# Patient Record
Sex: Male | Born: 2000 | Race: Black or African American | Hispanic: No | Marital: Single | Smoking: Never smoker
Health system: Southern US, Community
[De-identification: ages and names within clinical notes are randomized; demographics above are authoritative.]

---

## 2019-07-30 ENCOUNTER — Encounter (HOSPITAL_BASED_OUTPATIENT_CLINIC_OR_DEPARTMENT_OTHER): Payer: Self-pay

## 2019-07-30 ENCOUNTER — Other Ambulatory Visit: Payer: Self-pay

## 2019-07-30 ENCOUNTER — Emergency Department (HOSPITAL_BASED_OUTPATIENT_CLINIC_OR_DEPARTMENT_OTHER)

## 2019-07-30 ENCOUNTER — Emergency Department (HOSPITAL_BASED_OUTPATIENT_CLINIC_OR_DEPARTMENT_OTHER)
Admission: EM | Admit: 2019-07-30 | Discharge: 2019-07-30 | Disposition: A | Discharge status: Home / Self Care | Attending: Emergency Medicine | Admitting: Emergency Medicine

## 2019-07-30 DIAGNOSIS — Y9364 Activity, baseball: Secondary | ICD-10-CM | POA: Diagnosis not present

## 2019-07-30 DIAGNOSIS — Y92320 Baseball field as the place of occurrence of the external cause: Secondary | ICD-10-CM | POA: Insufficient documentation

## 2019-07-30 DIAGNOSIS — W51XXXA Accidental striking against or bumped into by another person, initial encounter: Secondary | ICD-10-CM | POA: Insufficient documentation

## 2019-07-30 DIAGNOSIS — M25561 Pain in right knee: Secondary | ICD-10-CM | POA: Diagnosis present

## 2019-07-30 DIAGNOSIS — Y999 Unspecified external cause status: Secondary | ICD-10-CM | POA: Diagnosis not present

## 2019-07-30 DIAGNOSIS — T1490XA Injury, unspecified, initial encounter: Secondary | ICD-10-CM

## 2019-07-30 MED ORDER — NAPROXEN 500 MG PO TABS: 500.0000 mg | ORAL_TABLET | Freq: Two times a day (BID) | ORAL | 0 refills | Status: DC

## 2019-07-30 NOTE — Discharge Instructions (Addendum)
Please read and follow all provided instructions.  You have been seen today for right knee pain  Tests performed today include: An x-ray of the affected area - does NOT show any broken bones or dislocations.  Vital signs. See below for your results today.   Home care instructions: -- *PRICE in the first 24-48 hours after injury: Protect (with brace, splint, sling), if given by your provider Rest Ice- Do not apply ice pack directly to your skin, place towel or similar between your skin and ice/ice pack. Apply ice for 20 min, then remove for 40 min while awake Compression- Wear brace, elastic bandage, splint as directed by your provider Elevate affected extremity above the level of your heart when not walking around for the first 24-48 hours   Medications:  - Naproxen is a nonsteroidal anti-inflammatory medication that will help with pain and swelling. Be sure to take this medication as prescribed with food, 1 pill every 12 hours,  It should be taken with food, as it can cause stomach upset, and more seriously, stomach bleeding. Do not take other nonsteroidal anti-inflammatory medications with this such as Advil, Motrin, Aleve, Mobic, Goodie Powder, or Motrin.    You make take Tylenol per over the counter dosing with these medications.   We have prescribed you new medication(s) today. Discuss the medications prescribed today with your pharmacist as they can have adverse effects and interactions with your other medicines including over the counter and prescribed medications. Seek medical evaluation if you start to experience new or abnormal symptoms after taking one of these medicines, seek care immediately if you start to experience difficulty breathing, feeling of your throat closing, facial swelling, or rash as these could be indications of a more serious allergic reaction   Follow-up instructions: Please follow-up with your primary care provider or the provided orthopedic physician (bone  specialist) if you continue to have significant pain in 1 week. In this case you may have a more severe injury that requires further care.   Return instructions:  Please return if your digits or extremity are numb or tingling, appear gray or blue, or you have severe pain (also elevate the extremity and loosen splint or wrap if you were given one) Please return if you have redness or fevers.  Please return to the Emergency Department if you experience worsening symptoms.  Please return if you have any other emergent concerns. Additional Information:  Your vital signs today were: BP 118/66 (BP Location: Right Arm)    Pulse 99    Temp 98.3 F (36.8 C) (Oral)    Resp 18    Ht 5\' 7"  (1.702 m)    Wt 80.3 kg    SpO2 100%    BMI 27.72 kg/m  If your blood pressure (BP) was elevated above 135/85 this visit, please have this repeated by your doctor within one month. ---------------

## 2019-07-30 NOTE — ED Provider Notes (Addendum)
[place] EMERGENCY DEPARTMENT Provider Note   CSN: [id number] Arrival date & time: 07/30/19  1604     History   Chief Complaint Chief Complaint  Patient presents with  . Knee Pain    HPI Wesley Erickson is a 18 y.o. male without significant past medical history who presents to the emergency department status post right lower extremity injury shortly PTA with complaints of knee, lower leg, and foot pain.  Patient states that he was playing baseball, running, when his knee collided with another player and he felt that he twisted around.  He states he is having pain primarily in the knee that radiates into the lower leg anteriorly as well as pain to the outside of the foot.  Discomfort is worse with movement.  No alleviating factors.  No intervention prior to arrival.  Denies fever, chills, numbness, tingling, weakness, other areas of injury. He has been ambulatory s/p injury.      HPI  History reviewed. No pertinent past medical history.  There are no active problems to display for this patient.   History reviewed. No pertinent surgical history.      Home Medications    Prior to Admission medications   Not on File    Family History History reviewed. No pertinent family history.  Social History Social History   Tobacco Use  . Smoking status: Never Smoker  . Smokeless tobacco: Never Used  Substance Use Topics  . Alcohol use: Not Currently  . Drug use: Never     Allergies   Patient has no allergy information on record.   Review of Systems Review of Systems  Constitutional: Negative for chills and fever.  Respiratory: Negative for shortness of breath.   Cardiovascular: Negative for chest pain.  Musculoskeletal: Positive for arthralgias and myalgias. Negative for back pain and neck pain.  Neurological: Negative for weakness and numbness.   Physical Exam Updated Vital Signs BP 118/66 (BP Location: Right Arm)   Pulse 99   Temp 98.3 F (36.8 C)  (Oral)   Resp 18   Ht 5\' 7"  (1.702 m)   Wt 80.3 kg   SpO2 100%   BMI 27.72 kg/m   Physical Exam Vitals signs and nursing note reviewed.  Constitutional:      General: He is not in acute distress.    Appearance: He is not ill-appearing or toxic-appearing.  HENT:     Head: Normocephalic and atraumatic.     Comments: No racoon eyes or battle sign.  Cardiovascular:     Rate and Rhythm: Normal rate.     Pulses:          Dorsalis pedis pulses are 2+ on the right side and 2+ on the left side.       Posterior tibial pulses are 2+ on the right side and 2+ on the left side.  Pulmonary:     Effort: Pulmonary effort is normal.     Breath sounds: Normal breath sounds.  Abdominal:     General: There is no distension.     Palpations: Abdomen is soft.     Tenderness: There is no abdominal tenderness.  Musculoskeletal:     Comments: Lower extremities: No obvious deformity, appreciable  erythema, ecchymosis, warmth, or open wounds. Patient has intact AROM to bilateral hips, knees, ankles, and all digits.  RLE: Patient is tender to palpation to the R patella, patella tendon region, and just medial to the patella. There is also tenderness to the mid R anterior  tibia and the midfoot. Otherwise Les are nontender. Compartments are soft.   Skin:    General: Skin is warm and dry.     Capillary Refill: Capillary refill takes less than 2 seconds.  Neurological:     Mental Status: He is alert.     Comments: Alert. Clear speech. Sensation grossly intact to bilateral lower extremities. 5/5 strength with knee flexion/extension and ankle plantar/dorsiflexion bilaterally. Patient ambulatory with antalgic gait.   Psychiatric:        Mood and Affect: Mood normal.        Behavior: Behavior normal.      ED Treatments / Results  Labs (all labs ordered are listed, but only abnormal results are displayed) Labs Reviewed - No data to display  EKG None  Radiology Dg Tibia/fibula Right  Result Date:  07/30/2019 CLINICAL DATA:  Baseball injury, twisting injury to knee EXAM: RIGHT KNEE - COMPLETE 4+ VIEW; RIGHT FOOT - 2 VIEW; RIGHT TIBIA AND FIBULA - 2 VIEW COMPARISON:  None. FINDINGS: No fracture or dislocation of the right knee. Joint spaces are well preserved. No knee joint effusion. Soft tissue edema overlying the anterior extensor mechanism. No fracture or dislocation of the right tibia or fibula. Soft tissues are unremarkable. No fracture or dislocation of the right foot. Joint spaces are preserved. Soft tissues are unremarkable. IMPRESSION: 1. No fracture or dislocation of the right knee. Joint spaces are well preserved. No knee joint effusion. Soft tissue edema overlying the anterior extensor mechanism. 2.  No fracture or dislocation of the right tibia or fibula. 3.  No fracture or dislocation of the right foot. Electronically Signed   By: Lauralyn PrimesAlex  Bibbey M.D.   On: 07/30/2019 16:41   Dg Knee Complete 4 Views Right  Result Date: 07/30/2019 CLINICAL DATA:  Baseball injury, twisting injury to knee EXAM: RIGHT KNEE - COMPLETE 4+ VIEW; RIGHT FOOT - 2 VIEW; RIGHT TIBIA AND FIBULA - 2 VIEW COMPARISON:  None. FINDINGS: No fracture or dislocation of the right knee. Joint spaces are well preserved. No knee joint effusion. Soft tissue edema overlying the anterior extensor mechanism. No fracture or dislocation of the right tibia or fibula. Soft tissues are unremarkable. No fracture or dislocation of the right foot. Joint spaces are preserved. Soft tissues are unremarkable. IMPRESSION: 1. No fracture or dislocation of the right knee. Joint spaces are well preserved. No knee joint effusion. Soft tissue edema overlying the anterior extensor mechanism. 2.  No fracture or dislocation of the right tibia or fibula. 3.  No fracture or dislocation of the right foot. Electronically Signed   By: Lauralyn PrimesAlex  Bibbey M.D.   On: 07/30/2019 16:41   Dg Foot 2 Views Right  Result Date: 07/30/2019 CLINICAL DATA:  Baseball injury,  twisting injury to knee EXAM: RIGHT KNEE - COMPLETE 4+ VIEW; RIGHT FOOT - 2 VIEW; RIGHT TIBIA AND FIBULA - 2 VIEW COMPARISON:  None. FINDINGS: No fracture or dislocation of the right knee. Joint spaces are well preserved. No knee joint effusion. Soft tissue edema overlying the anterior extensor mechanism. No fracture or dislocation of the right tibia or fibula. Soft tissues are unremarkable. No fracture or dislocation of the right foot. Joint spaces are preserved. Soft tissues are unremarkable. IMPRESSION: 1. No fracture or dislocation of the right knee. Joint spaces are well preserved. No knee joint effusion. Soft tissue edema overlying the anterior extensor mechanism. 2.  No fracture or dislocation of the right tibia or fibula. 3.  No fracture or dislocation of  the right foot. Electronically Signed   By: Lauralyn Primes M.D.   On: 07/30/2019 16:41    Procedures Procedures (including critical care time)  SPLINT APPLICATION Date: 07/30/19 Authorized by: Harvie Heck Consent: Verbal consent obtained. Risks and benefits: risks, benefits and alternatives were discussed Consent given by: patient Splint applied by: ED RN/EDT Location details: RLE Splint type: knee immobilizer Supplies used: knee immobilizer  Post-procedure: The splinted body part was neurovascularly unchanged following the procedure. Patient tolerance: Patient tolerated the procedure well with no immediate complications.   Medications Ordered in ED Medications - No data to display   Initial Impression / Assessment and Plan / ED Course  I have reviewed the triage vital signs and the nursing notes.  Pertinent labs & imaging results that were available during my care of the patient were reviewed by me and considered in my medical decision making (see chart for details).    Patient presents to the ED with complaints of pain to the  RLE s/p injury shortly PTA. Exam without obvious deformity or open wounds. ROM intact. Tender to  palpation to the anterior/medial knee, mid lower leg, and mid foot. NVI distally. Xray negative for fracture/dislocation. Therapeutic splint provided. PRICE and recommended. Naproxen prescription. I discussed results, treatment plan, need for follow-up, and return precautions with the patient. Provided opportunity for questions, patient confirmed understanding and are in agreement with plan.    Final Clinical Impressions(s) / ED Diagnoses   Final diagnoses:  Acute pain of right knee    ED Discharge Orders         Ordered    naproxen (NAPROSYN) 500 MG tablet  2 times daily     07/30/19 1705           Dayvon Dax, [place] R, PA-C 07/30/19 1715  Addendum for knee immobilizer application    Cherly Anderson, PA-C 07/30/19 1743    Virgina Norfolk, DO 07/30/19 1814

## 2019-07-30 NOTE — ED Triage Notes (Signed)
Pt at baseball practice ran into other player causing knee to twist inward. States tenderness to patella area that radiates down medial aspect of anterior calf.

## 2019-07-30 NOTE — ED Notes (Signed)
ED Provider at bedside. 

## 2020-09-06 ENCOUNTER — Ambulatory Visit (HOSPITAL_COMMUNITY)
Admission: EM | Admit: 2020-09-06 | Discharge: 2020-09-06 | Disposition: A | Discharge status: Home / Self Care | Attending: Family Medicine | Admitting: Family Medicine

## 2020-09-06 ENCOUNTER — Encounter (HOSPITAL_COMMUNITY): Payer: Self-pay | Admitting: Emergency Medicine

## 2020-09-06 ENCOUNTER — Other Ambulatory Visit: Payer: Self-pay

## 2020-09-06 DIAGNOSIS — Z113 Encounter for screening for infections with a predominantly sexual mode of transmission: Secondary | ICD-10-CM | POA: Diagnosis present

## 2020-09-06 NOTE — ED Triage Notes (Signed)
Pt presents for std testing. Pt states his partner states that she is having discharge and some symptoms so he just wanted to be checked. He is asymptomatic.

## 2020-09-06 NOTE — Discharge Instructions (Addendum)
Swab sent for testing.  We will call you with any positive results.  You can also check your MyChart for results.

## 2020-09-06 NOTE — ED Provider Notes (Signed)
[facility]    CSN: [id number] Arrival date & time: 09/06/20  1218      History   Chief Complaint Chief Complaint  Patient presents with  . Exposure to STD    HPI Wesley Erickson is a 19 y.o. male.   Patient is a 19 year old male presents today for STD screening.  Reporting his partner stated to him that she was having some abnormal discharge and other symptoms and he just would like to be checked.  He is currently asymptomatic.     History reviewed. No pertinent past medical history.  There are no problems to display for this patient.   History reviewed. No pertinent surgical history.     Home Medications    Prior to Admission medications   Medication Sig Start Date End Date Taking? Authorizing Provider  naproxen (NAPROSYN) 500 MG tablet Take 1 tablet (500 mg total) by mouth 2 (two) times daily. 07/30/19   Petrucelli, Pleas Koch, PA-C    Family History History reviewed. No pertinent family history.  Social History Social History   Tobacco Use  . Smoking status: Never Smoker  . Smokeless tobacco: Never Used  Substance Use Topics  . Alcohol use: Not Currently  . Drug use: Never     Allergies   Patient has no known allergies.   Review of Systems Review of Systems   Physical Exam Triage Vital Signs ED Triage Vitals  Enc Vitals Group     BP 09/06/20 1237 117/70     Pulse Rate 09/06/20 1237 92     Resp 09/06/20 1237 17     Temp 09/06/20 1237 98.3 F (36.8 C)     Temp Source 09/06/20 1237 Oral     SpO2 09/06/20 1237 99 %     Weight --      Height --      Head Circumference --      Peak Flow --      Pain Score 09/06/20 1235 0     Pain Loc --      Pain Edu? --      Excl. in [place]? --    No data found.  Updated Vital Signs BP 117/70 (BP Location: Left Arm)   Pulse 92   Temp 98.3 F (36.8 C) (Oral)   Resp 17   SpO2 99%   Visual Acuity Right Eye Distance:   Left Eye Distance:   Bilateral Distance:    Right Eye Near:   Left  Eye Near:    Bilateral Near:     Physical Exam Vitals and nursing note reviewed.  Constitutional:      Appearance: Normal appearance.  HENT:     Head: Normocephalic and atraumatic.     Nose: Nose normal.  Eyes:     Conjunctiva/sclera: Conjunctivae normal.  Pulmonary:     Effort: Pulmonary effort is normal.  Musculoskeletal:        General: Normal range of motion.     Cervical back: Normal range of motion.  Skin:    General: Skin is warm and dry.  Neurological:     Mental Status: He is alert.  Psychiatric:        Mood and Affect: Mood normal.      UC Treatments / Results  Labs (all labs ordered are listed, but only abnormal results are displayed) Labs Reviewed  CYTOLOGY, (ORAL, ANAL, URETHRAL) ANCILLARY ONLY    EKG   Radiology No results found.  Procedures Procedures (including critical care time)  Medications Ordered in UC Medications - No data to display  Initial Impression / Assessment and Plan / UC Course  I have reviewed the triage vital signs and the nursing notes.  Pertinent labs & imaging results that were available during my care of the patient were reviewed by me and considered in my medical decision making (see chart for details).     STD screening Swab sent for testing.  MyChart for results. Final Clinical Impressions(s) / UC Diagnoses   Final diagnoses:  Screen for STD (sexually transmitted disease)     Discharge Instructions     Swab sent for testing.  We will call you with any positive results.  You can also check your MyChart for results.   ED Prescriptions    None     PDMP not reviewed this encounter.   Janace Aris, NP 09/06/20 1452

## 2020-09-07 LAB — CYTOLOGY, (ORAL, ANAL, URETHRAL) ANCILLARY ONLY
Chlamydia: POSITIVE — AB
Comment: NEGATIVE
Comment: NEGATIVE
Comment: NORMAL
Neisseria Gonorrhea: NEGATIVE
Trichomonas: NEGATIVE

## 2020-09-08 ENCOUNTER — Telehealth (HOSPITAL_COMMUNITY): Payer: Self-pay | Admitting: Emergency Medicine

## 2020-09-08 MED ORDER — DOXYCYCLINE HYCLATE 100 MG PO CAPS: 100.0000 mg | ORAL_CAPSULE | Freq: Two times a day (BID) | ORAL | 0 refills | Status: AC

## 2020-09-22 IMAGING — DX DG TIBIA/FIBULA 2V*R*
2 series · 2 of 2 positions shown · non-contrast
Comparison: None.

CLINICAL DATA: Baseball injury, twisting injury to knee

EXAM:
RIGHT KNEE - COMPLETE 4+ VIEW; RIGHT FOOT - 2 VIEW; RIGHT TIBIA AND
FIBULA - 2 VIEW

[tibia ap (1 of 2)]
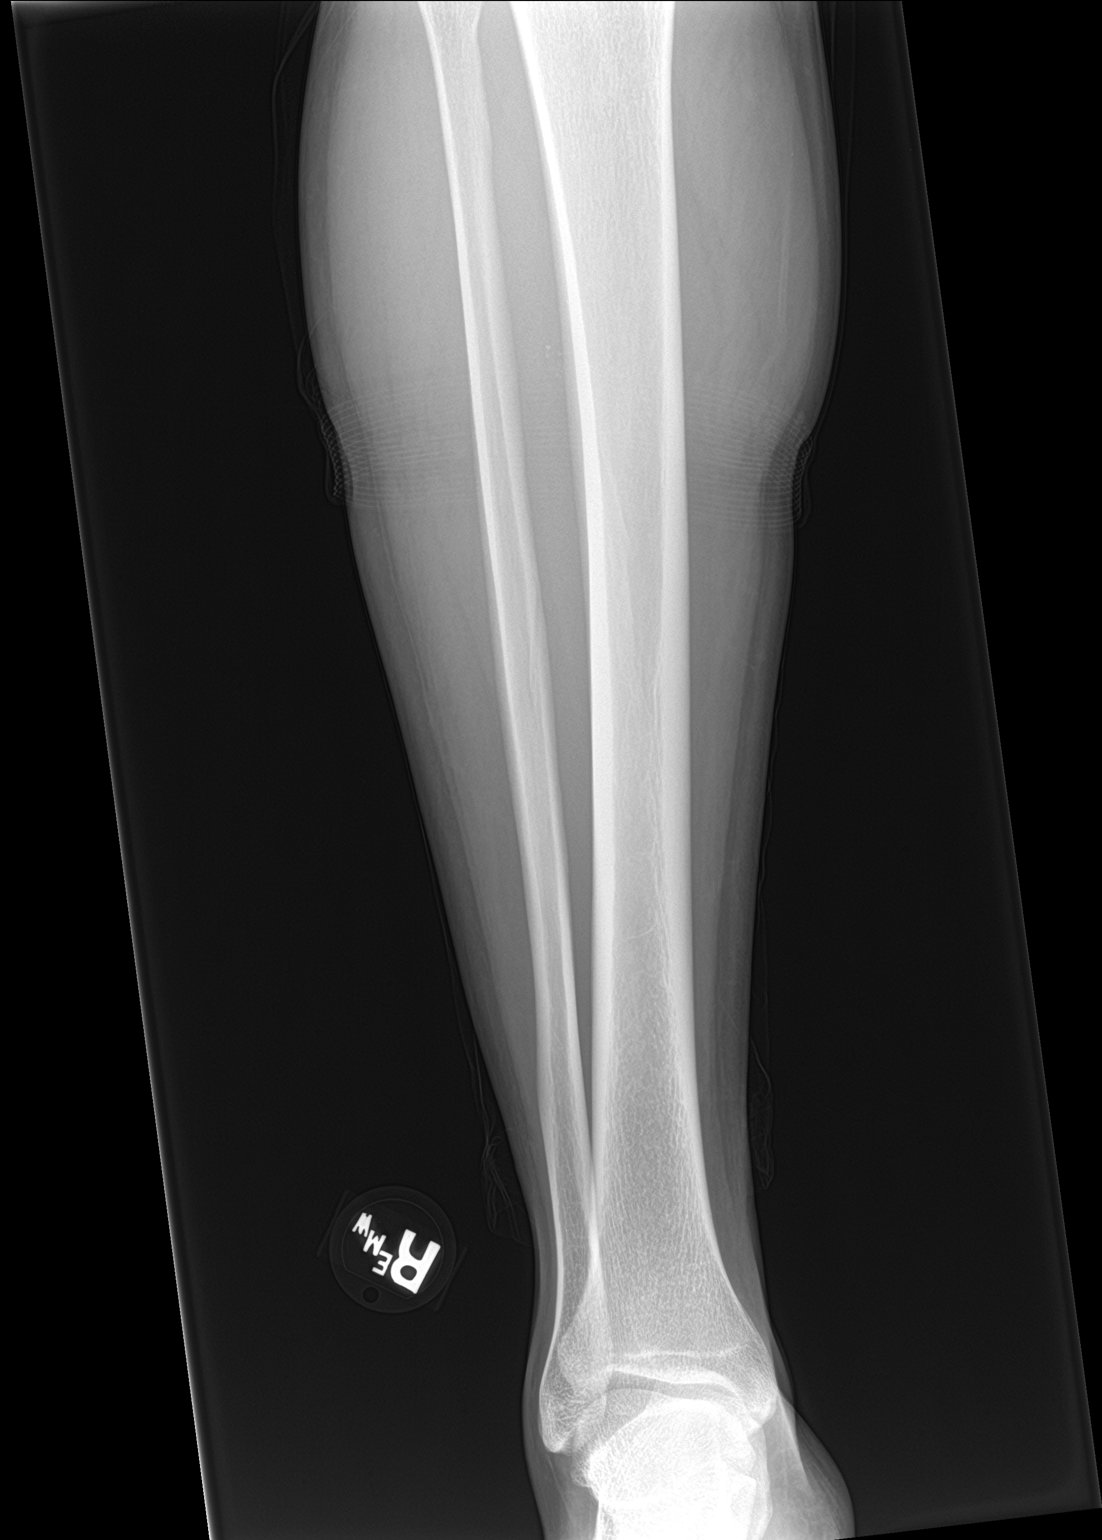

[tibia ap (2 of 2)]
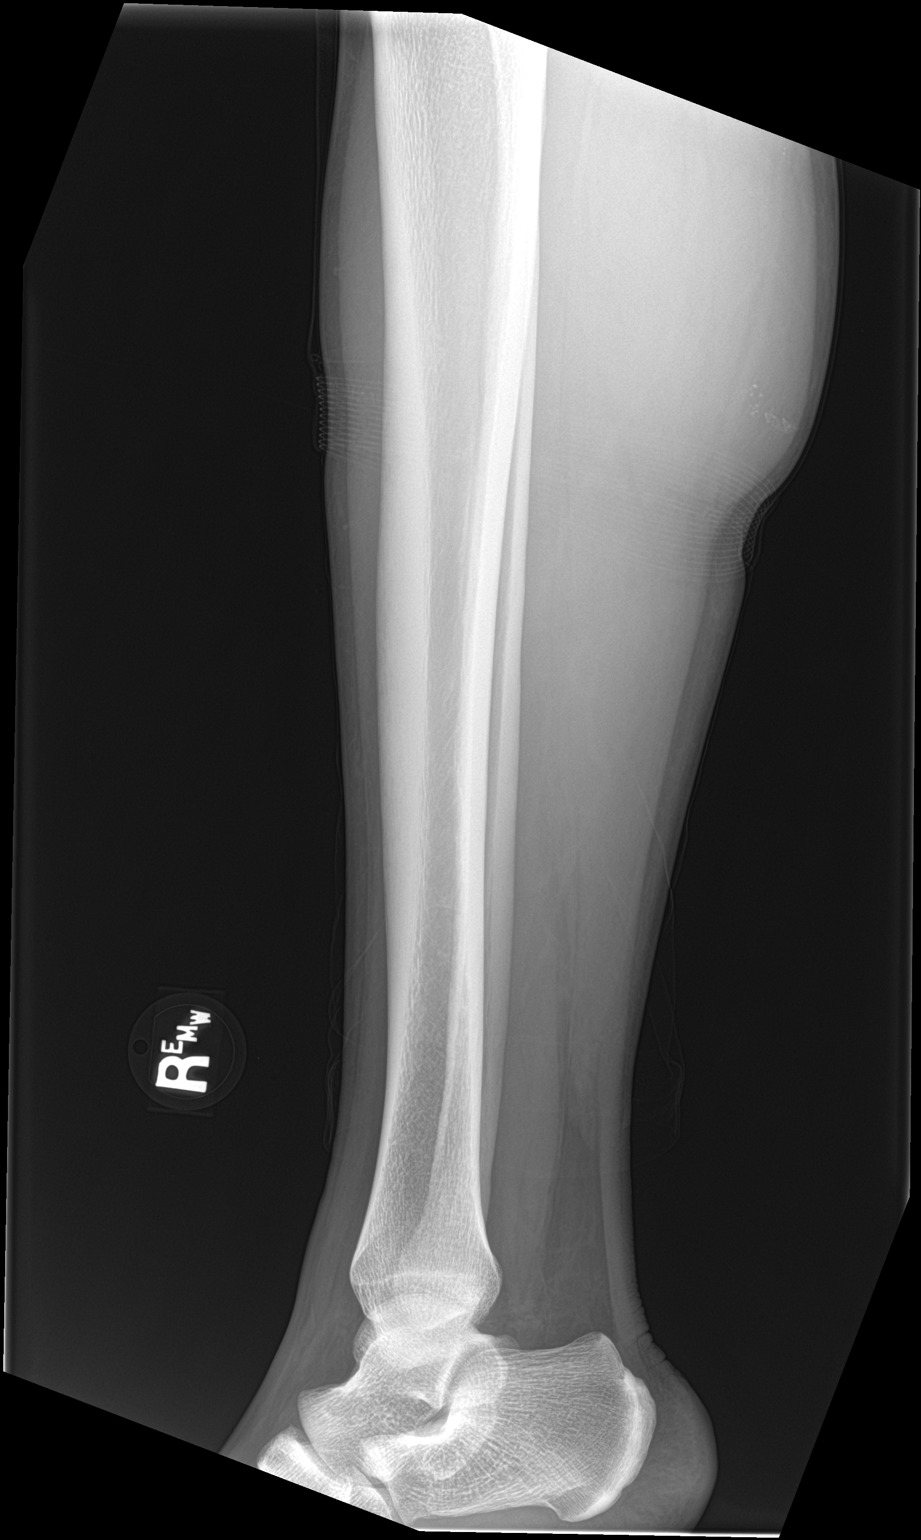

[2 of 2 positions shown; findings below may reference images not displayed]

FINDINGS: No fracture or dislocation of the right knee. Joint spaces are well
preserved. No knee joint effusion. Soft tissue edema overlying the
anterior extensor mechanism.

No fracture or dislocation of the right tibia or fibula. Soft
tissues are unremarkable.

No fracture or dislocation of the right foot. Joint spaces are
preserved. Soft tissues are unremarkable.
IMPRESSION: 1. No fracture or dislocation of the right knee. Joint spaces are
well preserved. No knee joint effusion. Soft tissue edema overlying
the anterior extensor mechanism.

2.  No fracture or dislocation of the right tibia or fibula.

3.  No fracture or dislocation of the right foot.

## 2020-09-22 IMAGING — DX DG KNEE COMPLETE 4+V*R*
4 series · 4 of 4 positions shown · non-contrast
Comparison: None.

CLINICAL DATA: Baseball injury, twisting injury to knee

EXAM:
RIGHT KNEE - COMPLETE 4+ VIEW; RIGHT FOOT - 2 VIEW; RIGHT TIBIA AND
FIBULA - 2 VIEW

[knee ap]
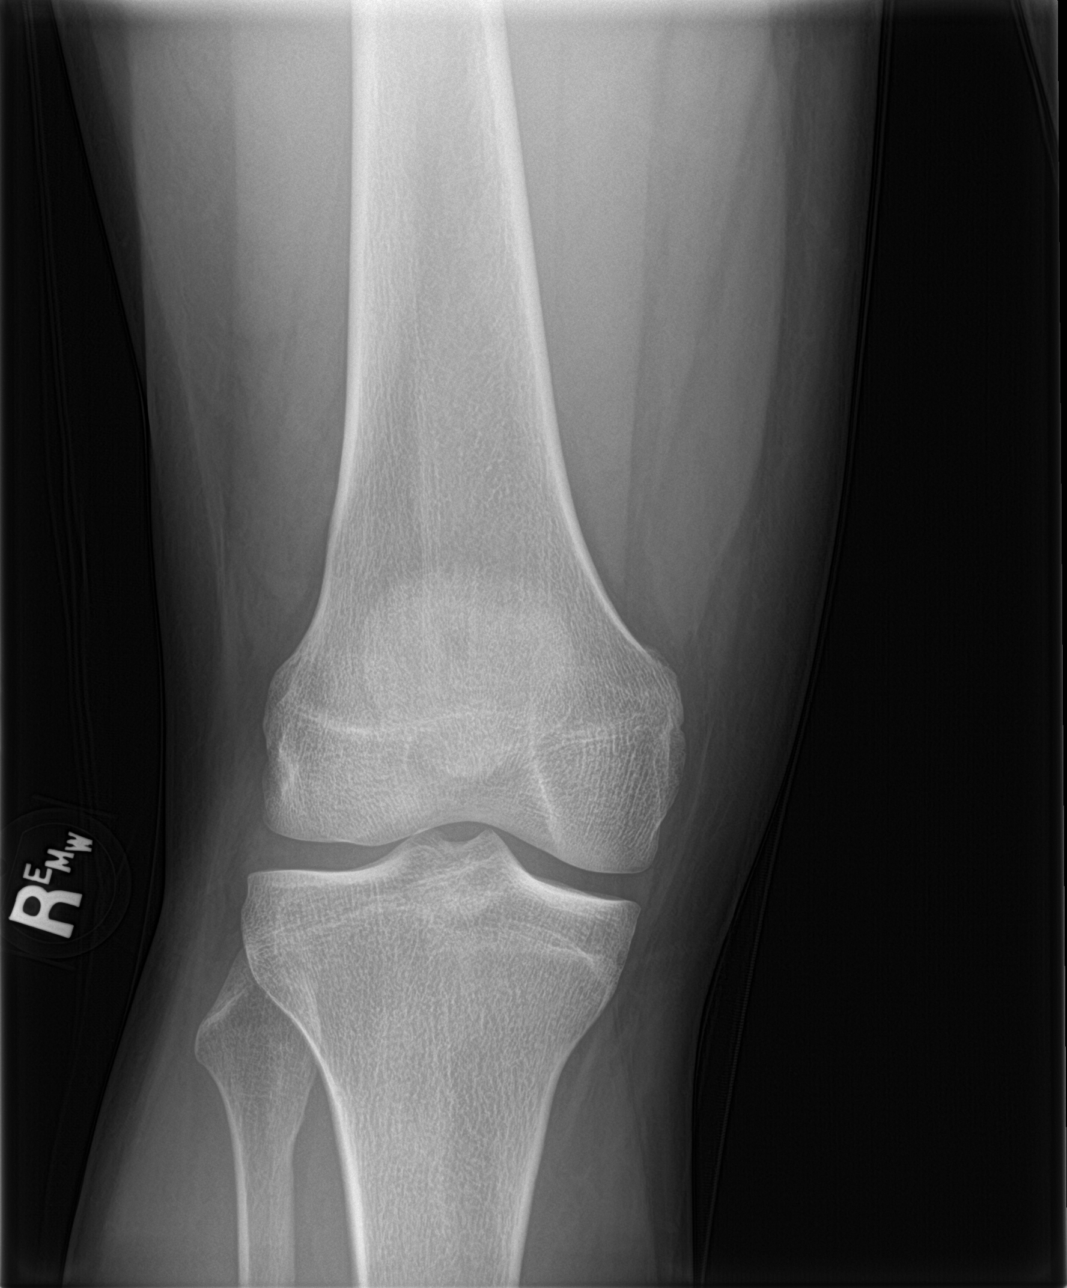

[knee lat]
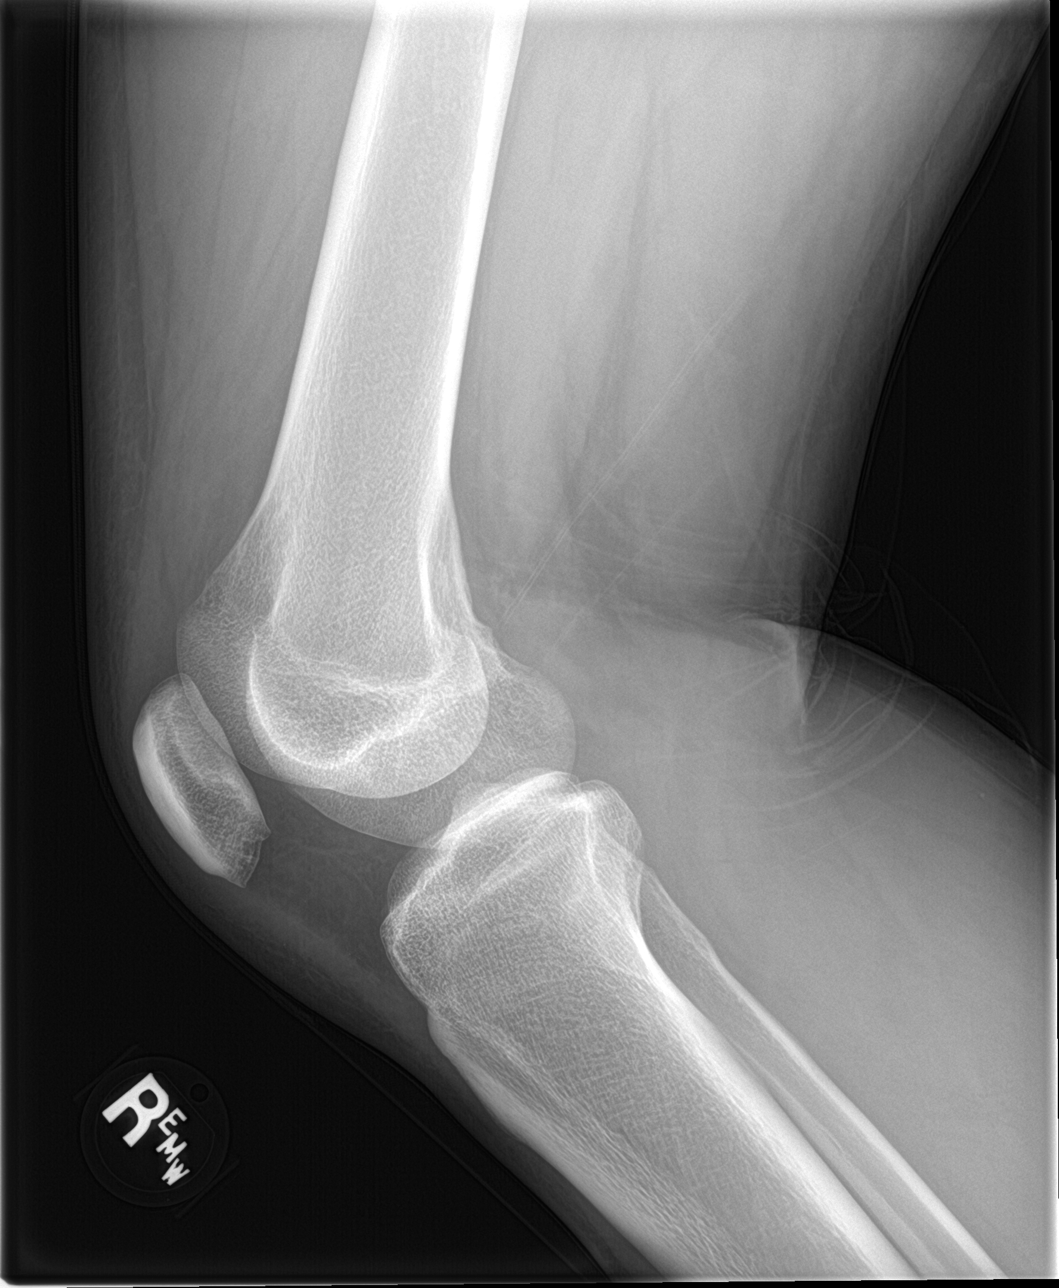

[knee obl (1 of 2)]
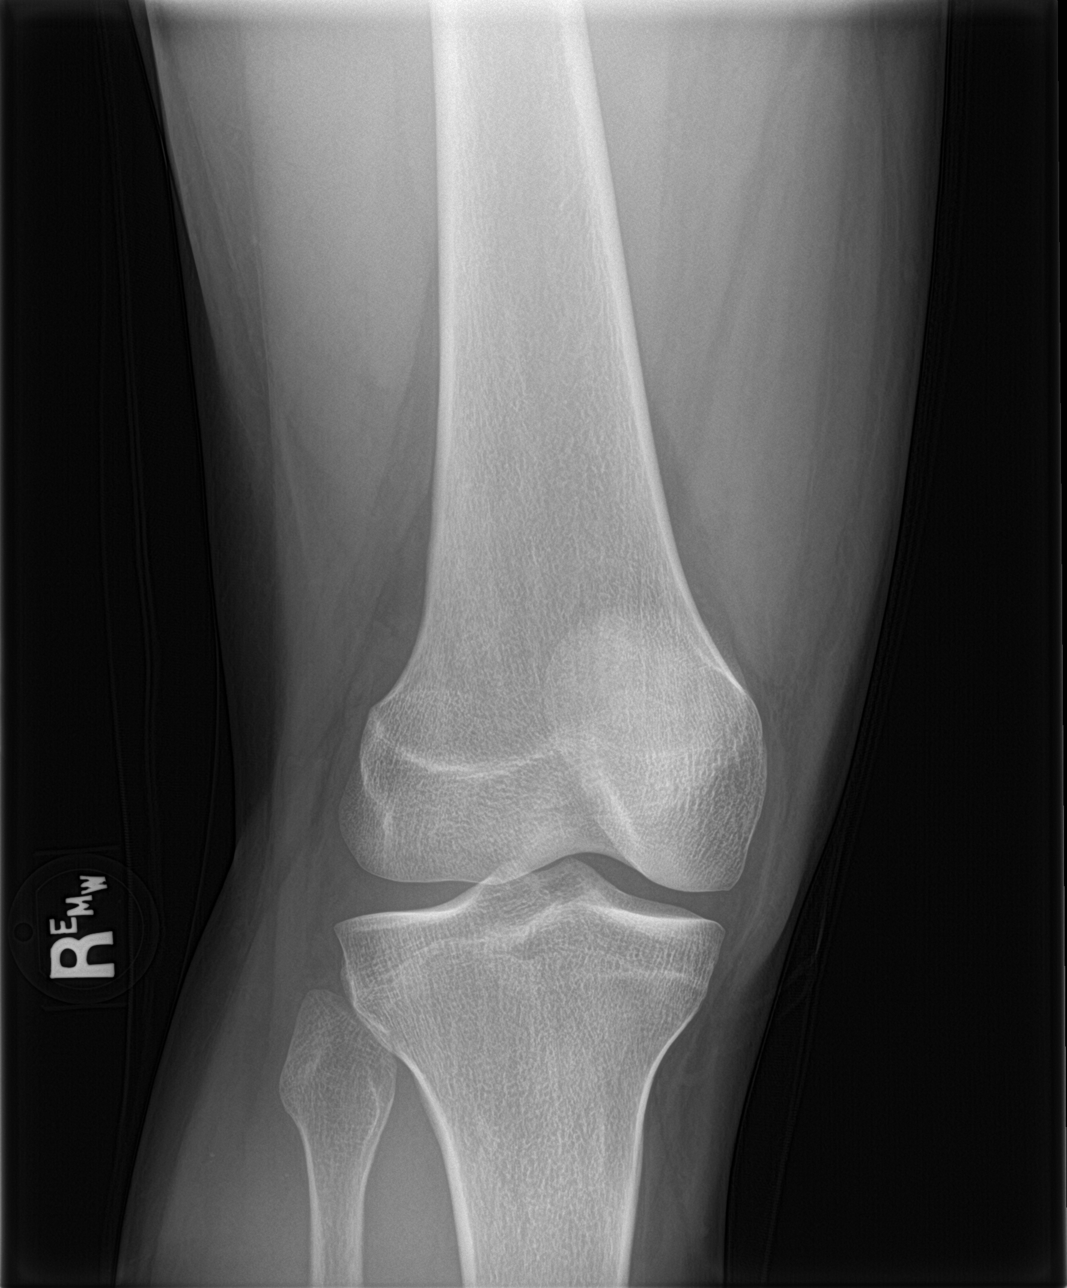

[knee obl (2 of 2)]
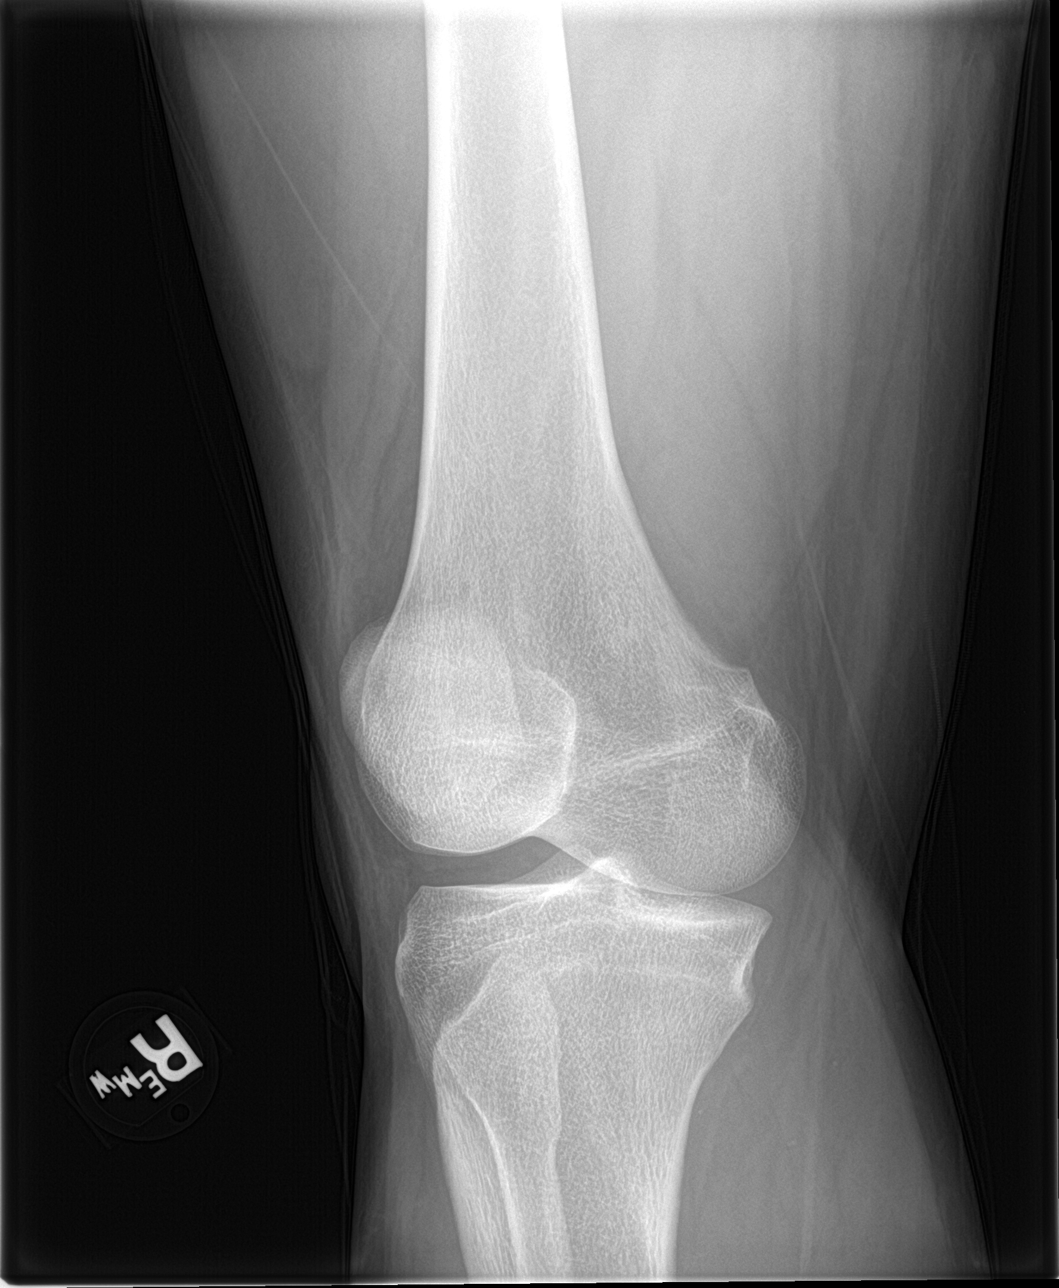

[4 of 4 positions shown; findings below may reference images not displayed]

FINDINGS: No fracture or dislocation of the right knee. Joint spaces are well
preserved. No knee joint effusion. Soft tissue edema overlying the
anterior extensor mechanism.

No fracture or dislocation of the right tibia or fibula. Soft
tissues are unremarkable.

No fracture or dislocation of the right foot. Joint spaces are
preserved. Soft tissues are unremarkable.
IMPRESSION: 1. No fracture or dislocation of the right knee. Joint spaces are
well preserved. No knee joint effusion. Soft tissue edema overlying
the anterior extensor mechanism.

2.  No fracture or dislocation of the right tibia or fibula.

3.  No fracture or dislocation of the right foot.

## 2022-11-03 ENCOUNTER — Ambulatory Visit (INDEPENDENT_AMBULATORY_CARE_PROVIDER_SITE_OTHER)

## 2022-11-03 ENCOUNTER — Encounter (HOSPITAL_COMMUNITY): Payer: Self-pay

## 2022-11-03 ENCOUNTER — Ambulatory Visit (HOSPITAL_COMMUNITY)
Admission: EM | Admit: 2022-11-03 | Discharge: 2022-11-03 | Disposition: A | Discharge status: Home / Self Care | Attending: Internal Medicine | Admitting: Internal Medicine

## 2022-11-03 DIAGNOSIS — R059 Cough, unspecified: Secondary | ICD-10-CM | POA: Insufficient documentation

## 2022-11-03 DIAGNOSIS — Z7952 Long term (current) use of systemic steroids: Secondary | ICD-10-CM | POA: Diagnosis not present

## 2022-11-03 DIAGNOSIS — J208 Acute bronchitis due to other specified organisms: Secondary | ICD-10-CM | POA: Insufficient documentation

## 2022-11-03 DIAGNOSIS — Z113 Encounter for screening for infections with a predominantly sexual mode of transmission: Secondary | ICD-10-CM | POA: Diagnosis not present

## 2022-11-03 DIAGNOSIS — R042 Hemoptysis: Secondary | ICD-10-CM | POA: Diagnosis not present

## 2022-11-03 DIAGNOSIS — Z1152 Encounter for screening for COVID-19: Secondary | ICD-10-CM | POA: Insufficient documentation

## 2022-11-03 MED ORDER — BENZONATATE 100 MG PO CAPS: 100.0000 mg | ORAL_CAPSULE | Freq: Three times a day (TID) | ORAL | 0 refills | Status: AC | PRN

## 2022-11-03 MED ORDER — PREDNISONE 20 MG PO TABS: 20.0000 mg | ORAL_TABLET | Freq: Every day | ORAL | 0 refills | Status: AC

## 2022-11-03 NOTE — ED Triage Notes (Signed)
Chief Complaint: productive cough with blood, chest congestion, chills. No body aches or runny nose.   Onset: 3 days   Prescriptions or OTC medications tried: Yes- Mucinex night time, Theraflu     with little relief  Sick exposure: No  New foods, medications, or products: No  Recent Travel: No

## 2022-11-03 NOTE — ED Provider Notes (Signed)
[place]    CSN: [id number] Arrival date & time: 11/03/22  1241      History   Chief Complaint Chief Complaint  Patient presents with   Cough   Chills   SEXUALLY TRANSMITTED DISEASE    Testing, no symptoms     HPI Wesley Erickson is a 22 y.o. male comes to the urgent care with 3-day history of cough productive of blood-tinged sputum, chest congestion and chills.  Patient denies any fever.  He denies any shortness of breath or wheezing.  No nausea or vomiting.  No diarrhea.  Patient denies any sore throat or runny nose.  He tried over-the-counter medication with no improvement in symptoms.  Patient is also requesting STD screening.  Patient has no symptoms.  No scrotal or groin pain.  HPI  History reviewed. No pertinent past medical history.  There are no problems to display for this patient.   History reviewed. No pertinent surgical history.     Home Medications    Prior to Admission medications   Medication Sig Start Date End Date Taking? Authorizing Provider  benzonatate (TESSALON) 100 MG capsule Take 1 capsule (100 mg total) by mouth 3 (three) times daily as needed for cough. 11/03/22  Yes Nayely Dingus, Myrene Galas, MD  predniSONE (DELTASONE) 20 MG tablet Take 1 tablet (20 mg total) by mouth daily for 5 days. 11/03/22 11/08/22 Yes Harshini Trent, Myrene Galas, MD    Family History History reviewed. No pertinent family history.  Social History Social History   Tobacco Use   Smoking status: Never   Smokeless tobacco: Never  Vaping Use   Vaping Use: Never used  Substance Use Topics   Alcohol use: Yes   Drug use: Never     Allergies   Patient has no known allergies.   Review of Systems Review of Systems As per HPI  Physical Exam Triage Vital Signs ED Triage Vitals  Enc Vitals Group     BP 11/03/22 1306 108/65     Pulse Rate 11/03/22 1306 95     Resp 11/03/22 1306 16     Temp 11/03/22 1306 98.7 F (37.1 C)     Temp Source 11/03/22 1306 Oral     SpO2  11/03/22 1306 94 %     Weight 11/03/22 1305 170 lb (77.1 kg)     Height 11/03/22 1305 5\' 7"  (1.702 m)     Head Circumference --      Peak Flow --      Pain Score 11/03/22 1304 0     Pain Loc --      Pain Edu? --      Excl. in [place]? --    No data found.  Updated Vital Signs BP 108/65 (BP Location: Right Arm)   Pulse 95   Temp 98.7 F (37.1 C) (Oral)   Resp 16   Ht 5\' 7"  (1.702 m)   Wt 77.1 kg   SpO2 94%   BMI 26.63 kg/m   Visual Acuity Right Eye Distance:   Left Eye Distance:   Bilateral Distance:    Right Eye Near:   Left Eye Near:    Bilateral Near:     Physical Exam Vitals and nursing note reviewed.  Constitutional:      General: He is not in acute distress.    Appearance: He is not ill-appearing.  HENT:     Right Ear: Tympanic membrane normal.     Left Ear: Tympanic membrane normal.  Mouth/Throat:     Mouth: Mucous membranes are moist.     Pharynx: No posterior oropharyngeal erythema.  Cardiovascular:     Rate and Rhythm: Normal rate and regular rhythm.     Pulses: Normal pulses.     Heart sounds: Normal heart sounds.  Pulmonary:     Effort: Pulmonary effort is normal.     Breath sounds: Normal breath sounds.  Abdominal:     General: Bowel sounds are normal.     Palpations: Abdomen is soft.  Neurological:     Mental Status: He is alert.      UC Treatments / Results  Labs (all labs ordered are listed, but only abnormal results are displayed) Labs Reviewed  SARS CORONAVIRUS 2 (TAT 6-24 HRS)  CYTOLOGY, (ORAL, ANAL, URETHRAL) ANCILLARY ONLY    EKG   Radiology DG Chest 2 View  Result Date: 11/03/2022 CLINICAL DATA:  Hemoptysis, productive cough with blood, chest congestion, chills EXAM: CHEST - 2 VIEW COMPARISON:  None Available. FINDINGS: Normal heart size, mediastinal contours, and pulmonary vascularity. Lungs clear. No pleural effusion or pneumothorax. Bones unremarkable. IMPRESSION: Normal exam. Electronically Signed   By: Lavonia Dana M.D.    On: 11/03/2022 14:10    Procedures Procedures (including critical care time)  Medications Ordered in UC Medications - No data to display  Initial Impression / Assessment and Plan / UC Course  I have reviewed the triage vital signs and the nursing notes.  Pertinent labs & imaging results that were available during my care of the patient were reviewed by me and considered in my medical decision making (see chart for details).     1.  Acute viral bronchitis: Chest x-ray is negative for acute lung infiltrate Tessalon Perles as needed for cough Prednisone 20 mg orally daily Please maintain adequate hydration Return precautions given.  2.  STD screening: Cytology for GC/chlamydia/trichomonas. Will call patient with recommendations if labs are abnormal. Final Clinical Impressions(s) / UC Diagnoses   Final diagnoses:  Acute viral bronchitis  Screening for STDs (sexually transmitted diseases)     Discharge Instructions      Please increase oral fluid intake  Please take medications as directed Your chest X-Ray is negative for pneumonia If you have worsening symptoms, please return to the urgent care for further evaluation  Take benzonatate 100 mg, 1 tab every 8 hours as needed for cough.  Take prednisone 20 mg--1 daily for 5 days    ED Prescriptions     Medication Sig Dispense Auth. Provider   benzonatate (TESSALON) 100 MG capsule Take 1 capsule (100 mg total) by mouth 3 (three) times daily as needed for cough. 21 capsule Treyden Hakim, Myrene Galas, MD   predniSONE (DELTASONE) 20 MG tablet Take 1 tablet (20 mg total) by mouth daily for 5 days. 5 tablet Jori Frerichs, Myrene Galas, MD      PDMP not reviewed this encounter.   Chase Picket, MD 11/03/22 [phone]

## 2022-11-03 NOTE — Discharge Instructions (Addendum)
Please increase oral fluid intake  Please take medications as directed Your chest X-Ray is negative for pneumonia If you have worsening symptoms, please return to the urgent care for further evaluation  Take benzonatate 100 mg, 1 tab every 8 hours as needed for cough.  Take prednisone 20 mg--1 daily for 5 days

## 2022-11-04 LAB — CYTOLOGY, (ORAL, ANAL, URETHRAL) ANCILLARY ONLY
Chlamydia: NEGATIVE
Comment: NEGATIVE
Comment: NEGATIVE
Comment: NORMAL
Neisseria Gonorrhea: NEGATIVE
Trichomonas: NEGATIVE

## 2022-11-04 LAB — SARS CORONAVIRUS 2 (TAT 6-24 HRS): SARS Coronavirus 2: NEGATIVE

## 2022-11-17 ENCOUNTER — Encounter (HOSPITAL_BASED_OUTPATIENT_CLINIC_OR_DEPARTMENT_OTHER): Payer: Self-pay | Admitting: Emergency Medicine

## 2022-11-17 ENCOUNTER — Other Ambulatory Visit: Payer: Self-pay

## 2022-11-17 ENCOUNTER — Emergency Department (HOSPITAL_BASED_OUTPATIENT_CLINIC_OR_DEPARTMENT_OTHER)
Admission: EM | Admit: 2022-11-17 | Discharge: 2022-11-17 | Disposition: A | Discharge status: Home / Self Care | Attending: Emergency Medicine | Admitting: Emergency Medicine

## 2022-11-17 DIAGNOSIS — Y92410 Unspecified street and highway as the place of occurrence of the external cause: Secondary | ICD-10-CM | POA: Insufficient documentation

## 2022-11-17 DIAGNOSIS — M542 Cervicalgia: Secondary | ICD-10-CM | POA: Diagnosis not present

## 2022-11-17 NOTE — ED Notes (Signed)
Pt in bed, pt denies numbness or tingling to arms or legs, pt reports some L sided neck pain/stiffness. Pt denies any midline tenderness to neck.  Pt awake and answering questions appropriately.
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
